# Patient Record
Sex: Female | Born: 1960 | Race: Black or African American | Hispanic: No | Marital: Single | Smoking: Former smoker
Health system: Southern US, Community
[De-identification: ages and names within clinical notes are randomized; demographics above are authoritative.]

## PROBLEM LIST (undated history)

## (undated) DIAGNOSIS — I10 Essential (primary) hypertension: Secondary | ICD-10-CM

## (undated) HISTORY — PX: REPAIR KNEE LIGAMENT: SUR1188

## (undated) HISTORY — PX: TUBAL LIGATION: SHX77

---

## 1999-09-16 ENCOUNTER — Encounter: Admission: RE | Admit: 1999-09-16 | Discharge: 1999-11-17 | Payer: Self-pay | Admitting: Orthopedic Surgery

## 2013-04-04 ENCOUNTER — Emergency Department: Payer: Self-pay | Admitting: Emergency Medicine

## 2013-09-05 ENCOUNTER — Emergency Department: Payer: Self-pay | Admitting: Emergency Medicine

## 2013-12-08 ENCOUNTER — Ambulatory Visit: Payer: Self-pay

## 2013-12-22 ENCOUNTER — Ambulatory Visit: Payer: Self-pay

## 2014-02-07 ENCOUNTER — Emergency Department: Payer: Self-pay | Admitting: Emergency Medicine

## 2014-02-07 LAB — CBC
HCT: 37.9 % (ref 35.0–47.0)
HGB: 12.2 g/dL (ref 12.0–16.0)
MCH: 25.7 pg — ABNORMAL LOW (ref 26.0–34.0)
MCHC: 32.1 g/dL (ref 32.0–36.0)
MCV: 80 fL (ref 80–100)
PLATELETS: 257 10*3/uL (ref 150–440)
RBC: 4.73 10*6/uL (ref 3.80–5.20)
RDW: 15.3 % — ABNORMAL HIGH (ref 11.5–14.5)
WBC: 7.7 10*3/uL (ref 3.6–11.0)

## 2014-02-07 LAB — BASIC METABOLIC PANEL WITH GFR
Anion Gap: 4 — ABNORMAL LOW
BUN: 11 mg/dL
Calcium, Total: 9.3 mg/dL
Chloride: 106 mmol/L
Co2: 31 mmol/L
Creatinine: 0.91 mg/dL
EGFR (African American): >60
EGFR (Non-African Amer.): >60
Glucose: 108 mg/dL — ABNORMAL HIGH
Osmolality: 281
Potassium: 3.4 mmol/L — ABNORMAL LOW
Sodium: 141 mmol/L

## 2014-02-07 LAB — TROPONIN I: Troponin-I: <0.02 ng/mL

## 2014-02-07 LAB — D-DIMER(ARMC): D-Dimer: 444 ng/ml

## 2014-03-13 ENCOUNTER — Emergency Department (HOSPITAL_COMMUNITY): Payer: BC Managed Care – PPO

## 2014-03-13 ENCOUNTER — Emergency Department (HOSPITAL_COMMUNITY)
Admission: EM | Admit: 2014-03-13 | Discharge: 2014-03-14 | Disposition: A | Discharge status: Home / Self Care | Payer: BC Managed Care – PPO | Attending: Emergency Medicine | Admitting: Emergency Medicine

## 2014-03-13 ENCOUNTER — Encounter (HOSPITAL_COMMUNITY): Payer: Self-pay | Admitting: Emergency Medicine

## 2014-03-13 DIAGNOSIS — Y929 Unspecified place or not applicable: Secondary | ICD-10-CM | POA: Insufficient documentation

## 2014-03-13 DIAGNOSIS — S46819A Strain of other muscles, fascia and tendons at shoulder and upper arm level, unspecified arm, initial encounter: Principal | ICD-10-CM

## 2014-03-13 DIAGNOSIS — S46219A Strain of muscle, fascia and tendon of other parts of biceps, unspecified arm, initial encounter: Secondary | ICD-10-CM

## 2014-03-13 DIAGNOSIS — R21 Rash and other nonspecific skin eruption: Secondary | ICD-10-CM | POA: Insufficient documentation

## 2014-03-13 DIAGNOSIS — S43499A Other sprain of unspecified shoulder joint, initial encounter: Secondary | ICD-10-CM | POA: Insufficient documentation

## 2014-03-13 DIAGNOSIS — Y939 Activity, unspecified: Secondary | ICD-10-CM | POA: Insufficient documentation

## 2014-03-13 DIAGNOSIS — Z87891 Personal history of nicotine dependence: Secondary | ICD-10-CM | POA: Insufficient documentation

## 2014-03-13 DIAGNOSIS — X58XXXA Exposure to other specified factors, initial encounter: Secondary | ICD-10-CM | POA: Insufficient documentation

## 2014-03-13 DIAGNOSIS — I10 Essential (primary) hypertension: Secondary | ICD-10-CM | POA: Insufficient documentation

## 2014-03-13 HISTORY — DX: Essential (primary) hypertension: I10

## 2014-03-13 LAB — I-STAT CHEM 8, ED
BUN: 13 mg/dL (ref 6–23)
CALCIUM ION: 1.23 mmol/L (ref 1.12–1.23)
CHLORIDE: 100 meq/L (ref 96–112)
Creatinine, Ser: 0.8 mg/dL (ref 0.50–1.10)
GLUCOSE: 115 mg/dL — AB (ref 70–99)
HEMATOCRIT: 43 % (ref 36.0–46.0)
Hemoglobin: 14.6 g/dL (ref 12.0–15.0)
POTASSIUM: 3.2 meq/L — AB (ref 3.7–5.3)
Sodium: 142 mEq/L (ref 137–147)
TCO2: 30 mmol/L (ref 0–100)

## 2014-03-13 LAB — URINALYSIS, ROUTINE W REFLEX MICROSCOPIC
Bilirubin Urine: NEGATIVE
GLUCOSE, UA: NEGATIVE mg/dL
HGB URINE DIPSTICK: NEGATIVE
Ketones, ur: NEGATIVE mg/dL
Leukocytes, UA: NEGATIVE
Nitrite: NEGATIVE
PH: 6.5 (ref 5.0–8.0)
PROTEIN: NEGATIVE mg/dL
Specific Gravity, Urine: 1.015 (ref 1.005–1.030)
Urobilinogen, UA: 0.2 mg/dL (ref 0.0–1.0)

## 2014-03-13 NOTE — ED Notes (Signed)
Pt reports flank pain on right side.

## 2014-03-13 NOTE — ED Notes (Signed)
Pt presents with Left upper arm swelling x3 months, pt states it's intermittent swelling, pt taking Benadryl OTC and Prednisone with no relief. Pt states she is on Prednisone for recent contact with poison oak. Pt also reports pain to her Right side x1 week

## 2014-03-13 NOTE — ED Notes (Signed)
Pt states swelling to left bicep is decreased from previous but increase in pain.

## 2014-03-14 LAB — CBC
HEMATOCRIT: 38.3 % (ref 36.0–46.0)
HEMOGLOBIN: 12.7 g/dL (ref 12.0–15.0)
MCH: 26.7 pg (ref 26.0–34.0)
MCHC: 33.2 g/dL (ref 30.0–36.0)
MCV: 80.6 fL (ref 78.0–100.0)
Platelets: 352 10*3/uL (ref 150–400)
RBC: 4.75 MIL/uL (ref 3.87–5.11)
RDW: 14.1 % (ref 11.5–15.5)
WBC: 12.6 10*3/uL — AB (ref 4.0–10.5)

## 2014-03-14 MED ORDER — OXYCODONE-ACETAMINOPHEN 5-325 MG PO TABS: 2.0000 | ORAL_TABLET | Freq: Once | ORAL | Status: DC

## 2014-03-14 NOTE — ED Notes (Signed)
MD at bedside. 

## 2014-03-14 NOTE — Discharge Instructions (Signed)
Wear sling for comfort. Take Percocet as prescribed and as needed. Call your surgeon in the morning to schedule close followup as discussed. Minimize use of your left arm until further evaluated by your surgeon.  Muscle Tear  A muscle tear is usually caused by over-exertion or stretching. The muscle often takes a while to heal. Muscle tears require 3 to 4 weeks to heal completely. A history of the injury and a physical exam may be performed. Sometimes, the injury is identified with radiographs and an MRI. Treatment for muscle injuries includes:  Resting and protecting the affected area until pain with motion is gone.  Putting ice on the injured area.  Put ice in a bag.  Place a towel between your skin and the bag.  Leave the ice on for 15 to 20 minutes, 3 to 4 times a day.  After two days, you can use heat to relieve spasms.  Using compression wraps to help control swelling and limit movement.  Raising (elevate) the injured area above the level of the heart (if possible) for the first 1 to 2 days after the injury.  Medicine may be prescribed to reduce pain and inflammation. Avoid strenuous activities that bring on muscle pain. Exercises to strengthen and stretch the injured muscle can help heal the muscle and prevent repeated injury. After the pain and swelling are gone, you may begin gradual strengthening exercises. Begin range-of-motion exercises and gentle stretching after 3 to 4 days of rest.  SEEK MEDICAL CARE IF:  Your injured muscle is not improving after 1 week of treatment.  Document Released: 12/03/2004 Document Revised: 01/18/2012 Document Reviewed: 05/10/2009  [facility] Patient Information 2014 [place].

## 2014-03-14 NOTE — ED Provider Notes (Signed)
CSN: [id number]     Arrival date & time 03/13/14  1735 History   First MD Initiated Contact with Patient 03/13/14 2355     Chief Complaint  Patient presents with  . Arm Swelling     (Consider location/radiation/quality/duration/timing/severity/associated sxs/prior Treatment) HPI History provided by patient. Had a work related injury 3 months ago. At that time had a torn meniscus in her knee, right wrist injury in addition to torn left biceps tendon. She is followed by [employer]. primary care physician. She had her knee repaired by Dr. Berenice Primas orthopedics. She was told by Dr. Berenice Primas to have an MRI of her left arm before having it repaired. Patient has seen her primary care physician, who referred her to a different orthopedic surgeon but she prefers to see Dr. Berenice Primas. She has not had an MRI. She presents to emergency room today requesting an MRI and pain medication. She is currently taking prednisone because she was moving mulch and has been using her left arm regularly. She has some increased swelling and pain tonight. She takes Percocet at home. She denies any weakness or numbness. No rash or new symptoms otherwise.  Past Medical History  Diagnosis Date  . Hypertension    Past Surgical History  Procedure Laterality Date  . Repair knee ligament    . Tubal ligation     History reviewed. No pertinent family history. History  Substance Use Topics  . Smoking status: Former Research scientist (life sciences)  . Smokeless tobacco: Not on file  . Alcohol Use: Yes   OB History   Grav Para Term Preterm Abortions TAB SAB Ect Mult Living                 Review of Systems  Constitutional: Negative for fever and chills.  Respiratory: Negative for shortness of breath.   Cardiovascular: Negative for chest pain.  Gastrointestinal: Negative for abdominal pain.  Genitourinary: Negative for flank pain.  Musculoskeletal: Negative for back pain, neck pain and neck stiffness.  Skin: Positive for rash.  Neurological:  Negative for headaches.  All other systems reviewed and are negative.     Allergies  Aspirin  Home Medications   Prior to Admission medications   Medication Sig Start Date End Date Taking? Authorizing Provider  diphenhydrAMINE (SOMINEX) 25 MG tablet Take 25 mg by mouth at bedtime as needed for itching, allergies or sleep.   Yes Historical Provider, MD  predniSONE (DELTASONE) 20 MG tablet Take 20 mg by mouth See admin instructions. Take 3 tab in AM for 5 days , 2 tab in AM for 5 days , 1Tab in AM for 5 days,then 1/2 tab for 4 days.  Started last Wednesday   Yes Historical Provider, MD   BP 143/88  Pulse 88  Temp(Src) 97.4 F (36.3 C) (Oral)  Resp 18  SpO2 100% Physical Exam  Constitutional: She is oriented to person, place, and time. She appears well-developed and well-nourished.  HENT:  Head: Normocephalic and atraumatic.  Eyes: EOM are normal. Pupils are equal, round, and reactive to light.  Neck: Neck supple.  Cardiovascular: Normal rate, regular rhythm and intact distal pulses.   Pulmonary/Chest: Effort normal and breath sounds normal. No respiratory distress.  Abdominal: Soft. There is no tenderness.  Musculoskeletal: Normal range of motion.  Left biceps region area of swelling consistent with reported history of muscle injury. Good range of motion throughout without erythema or increased warmth to touch. Good range of motion at shoulder, elbow and wrist with distal neurovascular intact.  Neurological: She is alert and oriented to person, place, and time.  Skin: Skin is warm and dry.  Mild erythematous rash to right forearm, with mild excoriations. No petechiae or purpura.    ED Course  Procedures (including critical care time) Labs Review Labs Reviewed  CBC - Abnormal; Notable for the following:    WBC 12.6 (*)    All other components within normal limits  I-STAT CHEM 8, ED - Abnormal; Notable for the following:    Potassium 3.2 (*)    Glucose, Bld 115 (*)    All  other components within normal limits  URINALYSIS, ROUTINE W REFLEX MICROSCOPIC    Imaging Review Dg Humerus Left  03/13/2014   CLINICAL DATA:  Injured left humerus, pain  EXAM: LEFT HUMERUS - 2+ VIEW  COMPARISON:  None.  FINDINGS: There is no evidence of fracture or other focal bone lesions. Soft tissues are unremarkable.  IMPRESSION: Negative.   Electronically Signed   By: Kathreen Devoid   On: 03/13/2014 22:58   Patient had requested an x-ray and blood work which was reviewed as above. Patient was evaluated after hours and no MRI is available at this time. I had a long talk with her about options for getting outpatient MRI which include her primary care physician, her orthopedic physician or her doctors at [facility].  She is agreeable to call her orthopedic surgeon and primary care physician in the morning to schedule this study. She was provided with a sling, given 2 Percocet. She will continue medications at home as prescribed and followup as an outpatient. Return precautions provided and verbalizes understood.   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 She is followed by primary care to orthopedics and Work[employer]'s [employer]. she will call the morning to schedule followup for MRI as needed and surgical repair. No indication for further workup in the ER at this time. No indication for emergent MRI.    Teressa Lower, MD 03/14/14 [phone]

## 2016-02-19 ENCOUNTER — Other Ambulatory Visit: Payer: Self-pay | Admitting: Family Medicine

## 2016-02-19 ENCOUNTER — Ambulatory Visit
Admission: RE | Admit: 2016-02-19 | Discharge: 2016-02-19 | Disposition: A | Discharge status: Home / Self Care | Payer: Disability Insurance | Source: Ambulatory Visit | Attending: Family Medicine | Admitting: Family Medicine

## 2016-02-19 DIAGNOSIS — M25562 Pain in left knee: Secondary | ICD-10-CM | POA: Diagnosis present

## 2016-02-19 DIAGNOSIS — M549 Dorsalgia, unspecified: Secondary | ICD-10-CM

## 2016-02-19 DIAGNOSIS — M5136 Other intervertebral disc degeneration, lumbar region: Secondary | ICD-10-CM | POA: Insufficient documentation

## 2016-12-09 ENCOUNTER — Other Ambulatory Visit: Payer: Self-pay | Admitting: Preventative Medicine

## 2016-12-09 ENCOUNTER — Ambulatory Visit
Admission: RE | Admit: 2016-12-09 | Discharge: 2016-12-09 | Disposition: A | Discharge status: Home / Self Care | Payer: Disability Insurance | Source: Ambulatory Visit | Attending: Preventative Medicine | Admitting: Preventative Medicine

## 2016-12-09 DIAGNOSIS — M1711 Unilateral primary osteoarthritis, right knee: Secondary | ICD-10-CM | POA: Insufficient documentation

## 2016-12-09 DIAGNOSIS — D259 Leiomyoma of uterus, unspecified: Secondary | ICD-10-CM | POA: Insufficient documentation

## 2016-12-09 DIAGNOSIS — M1710 Unilateral primary osteoarthritis, unspecified knee: Secondary | ICD-10-CM

## 2016-12-09 DIAGNOSIS — G5600 Carpal tunnel syndrome, unspecified upper limb: Secondary | ICD-10-CM | POA: Insufficient documentation

## 2016-12-09 DIAGNOSIS — I10 Essential (primary) hypertension: Secondary | ICD-10-CM | POA: Diagnosis not present

## 2016-12-09 DIAGNOSIS — M539 Dorsopathy, unspecified: Secondary | ICD-10-CM

## 2016-12-09 DIAGNOSIS — M259 Joint disorder, unspecified: Secondary | ICD-10-CM

## 2016-12-09 DIAGNOSIS — Z8572 Personal history of non-Hodgkin lymphomas: Secondary | ICD-10-CM | POA: Insufficient documentation

## 2016-12-09 DIAGNOSIS — E0590 Thyrotoxicosis, unspecified without thyrotoxic crisis or storm: Secondary | ICD-10-CM | POA: Diagnosis not present

## 2018-07-07 IMAGING — CR DG LUMBAR SPINE 2-3V
3 series · 3 of 3 positions shown · non-contrast
Comparison: Lumbar spine films of 02/19/2016

CLINICAL DATA: Chronic back pain, no acute injury

EXAM:
LUMBAR SPINE - 2-3 VIEW

[l-spine ap]
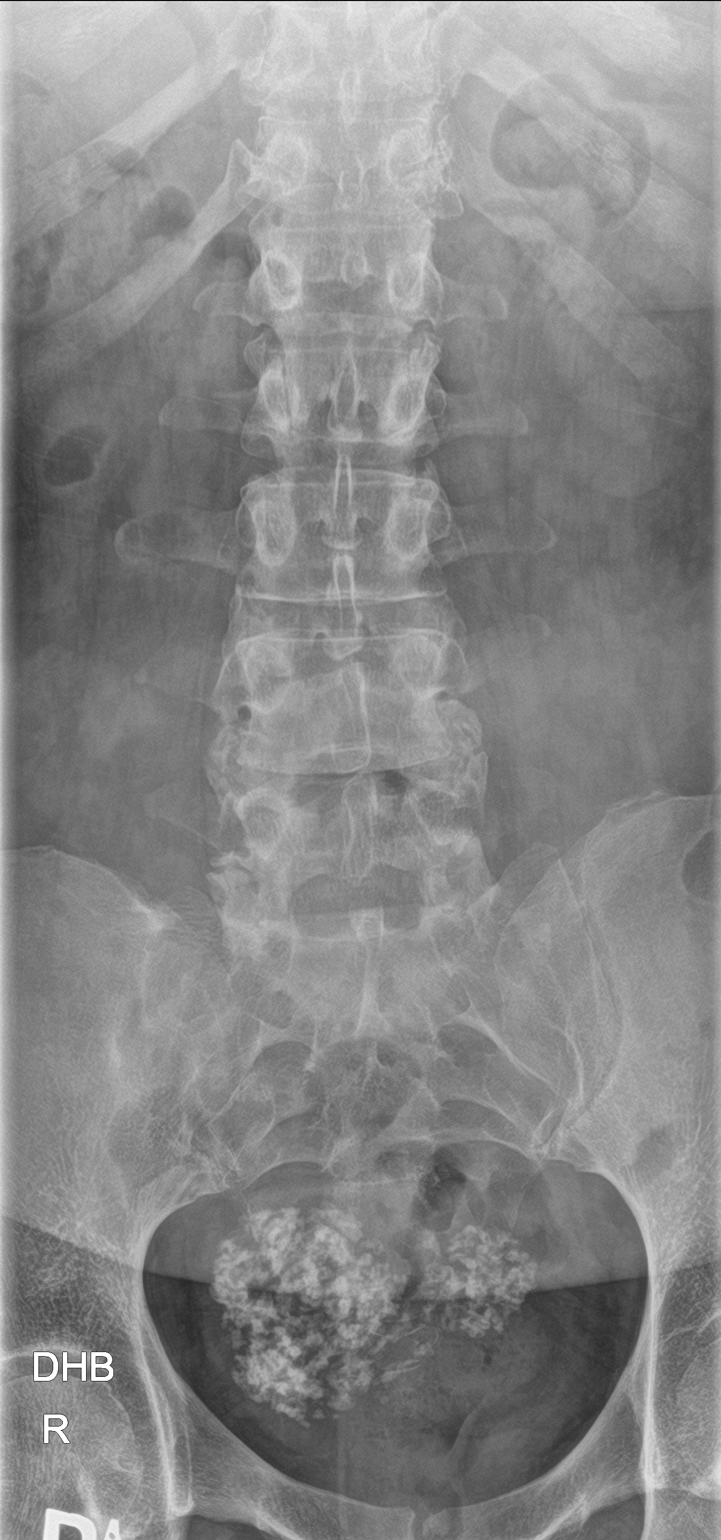

[l-spine lat]
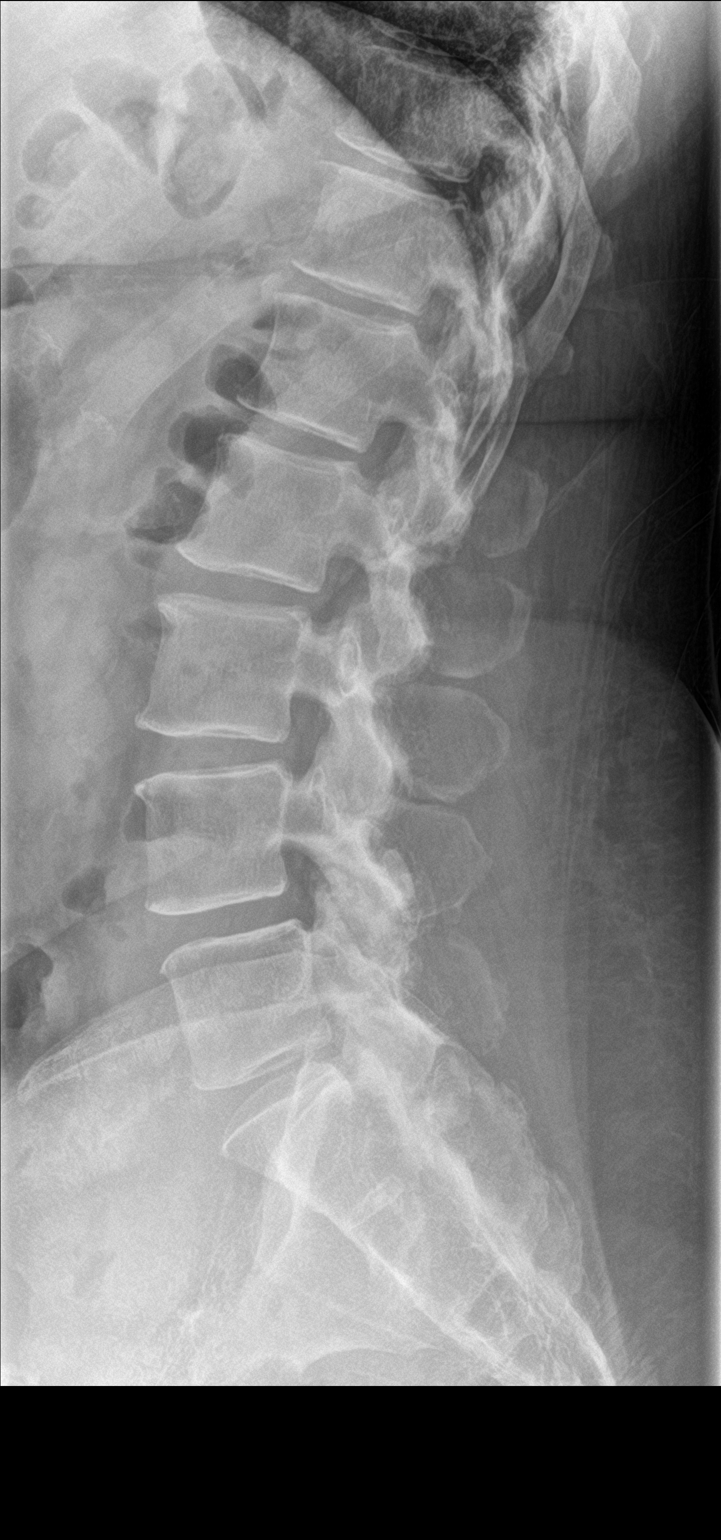

[l-spine spot]
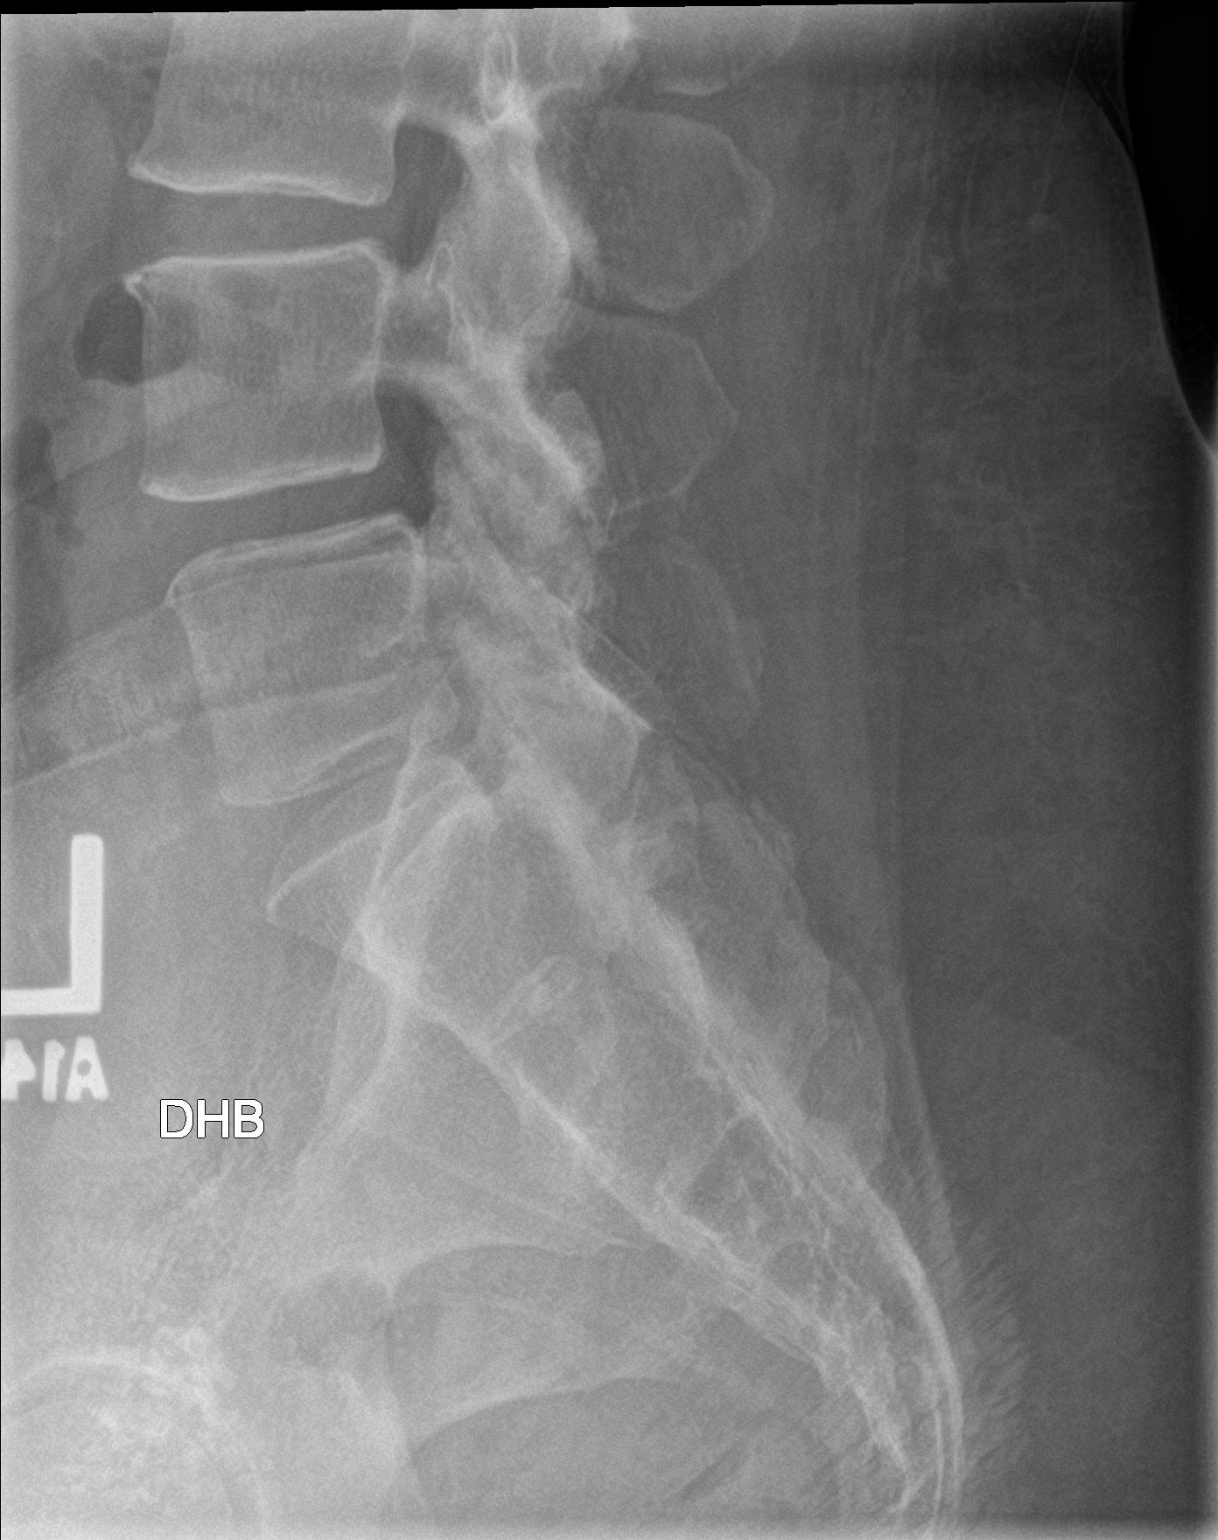

[3 of 3 positions shown; findings below may reference images not displayed]

FINDINGS: The lumbar vertebrae are in normal alignment with slightly
diminished disc space at L4-5. The remainder of disc spaces appear
normal. No compression deformity is seen. The SI joints are
corticated. Mottled calcifications in the pelvis are consistent with
calcified uterine fibroids.
IMPRESSION: Normal alignment. Minimally decreased disc space at L4-5. Calcified
uterine fibroids.

## 2018-07-07 IMAGING — CR DG SHOULDER 2+V*R*
3 series · 3 of 3 positions shown · non-contrast
Comparison: None.

CLINICAL DATA: Shoulder pain for many years, no acute injury

EXAM:
RIGHT SHOULDER - 2+ VIEW

[shoulder grashey]
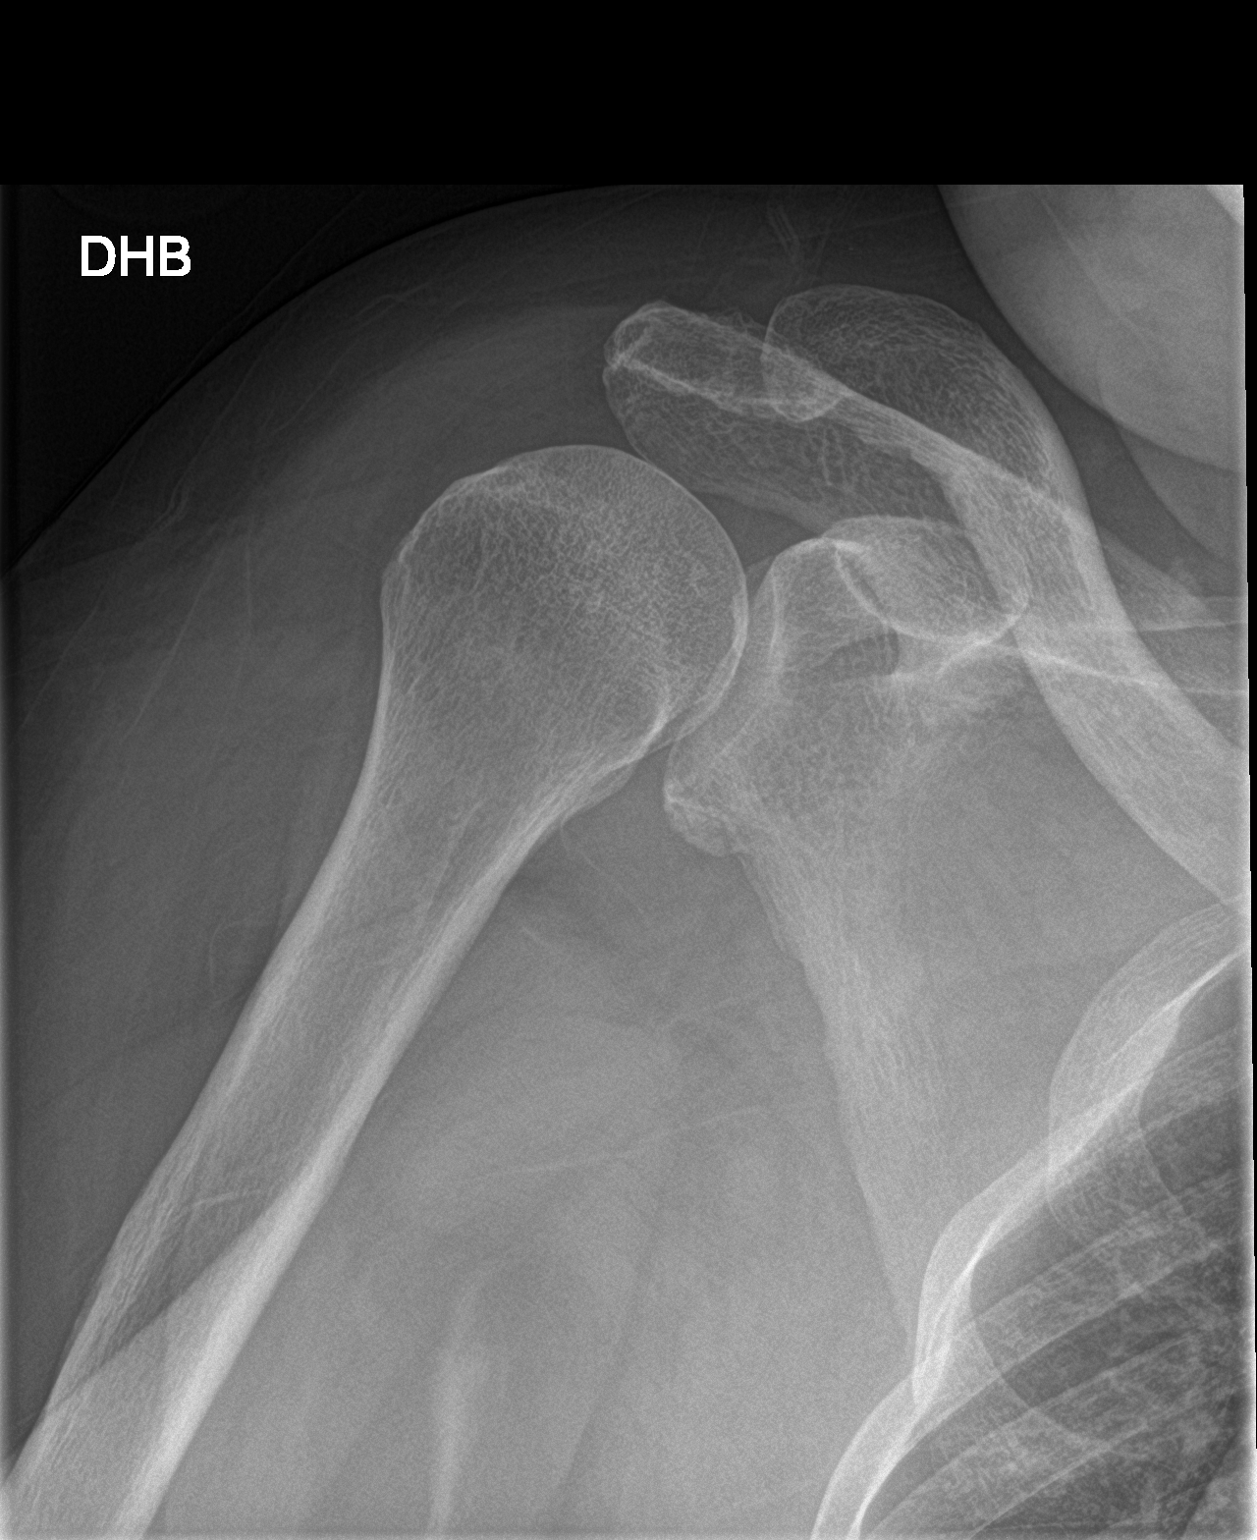

[shoulder y view]
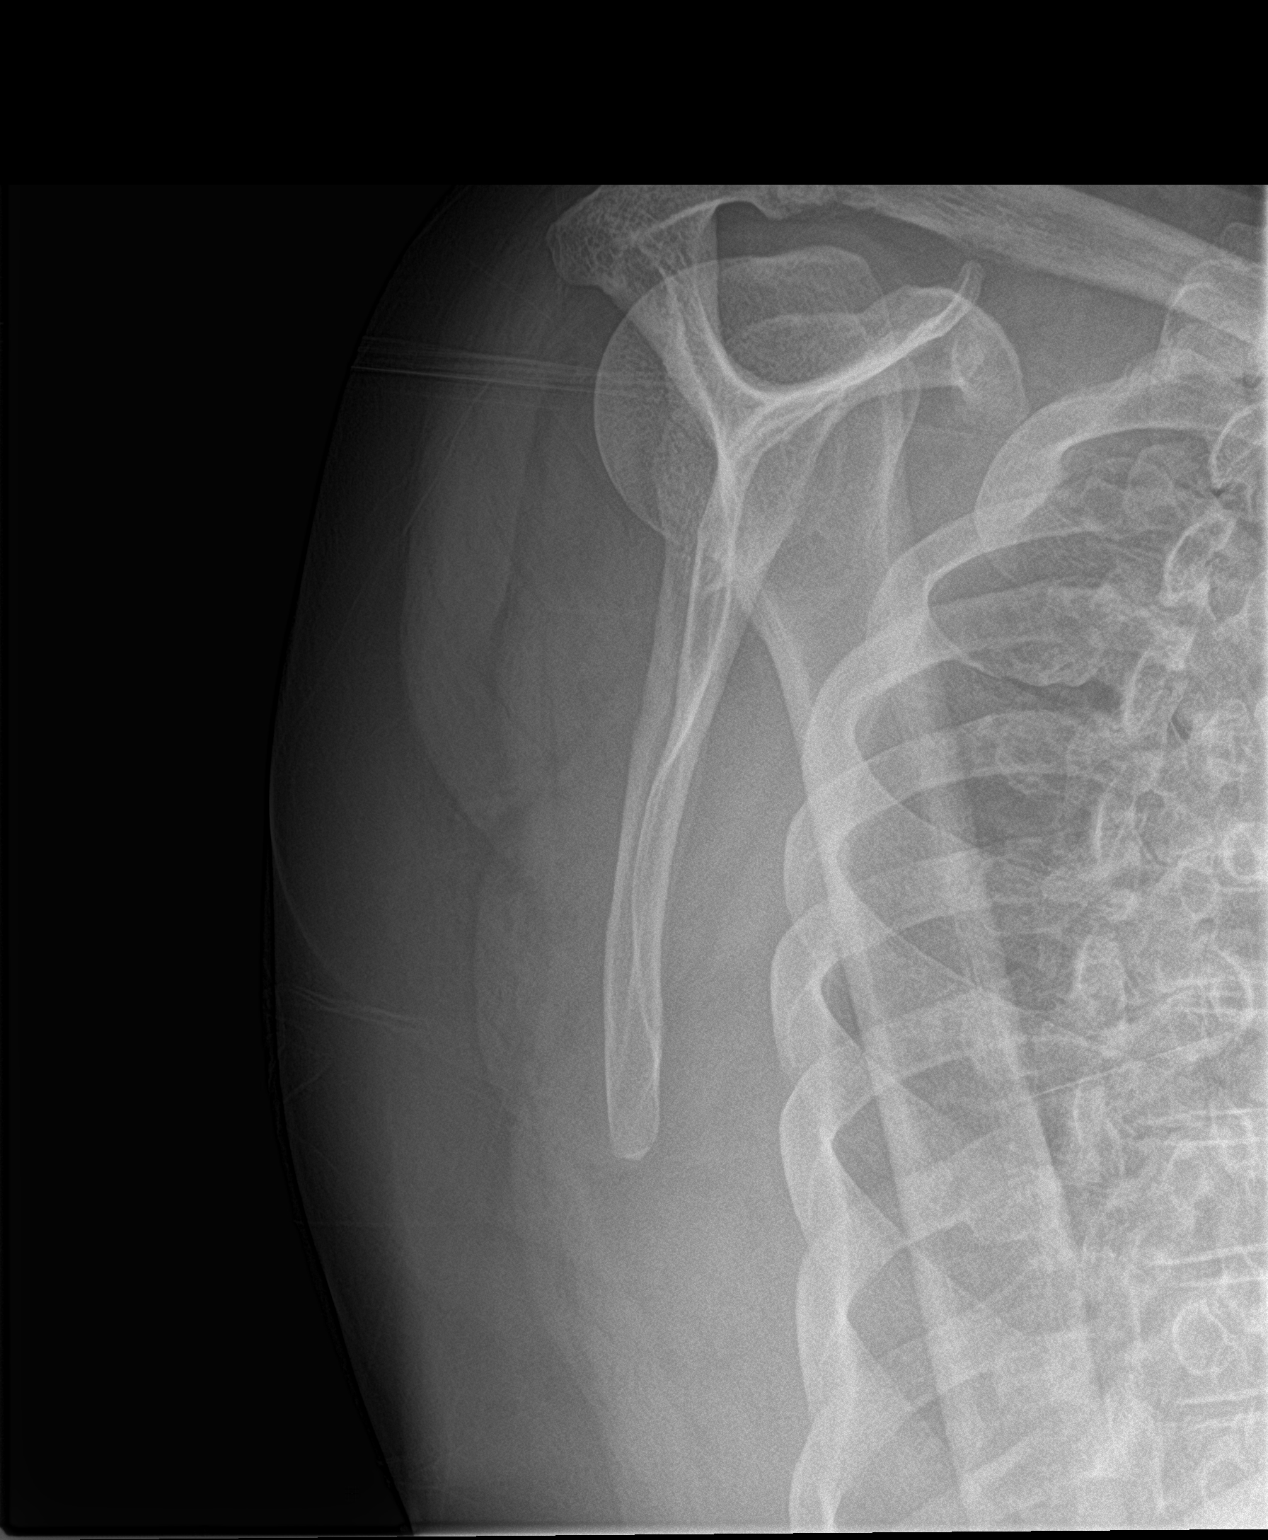

[shoulder axillary]
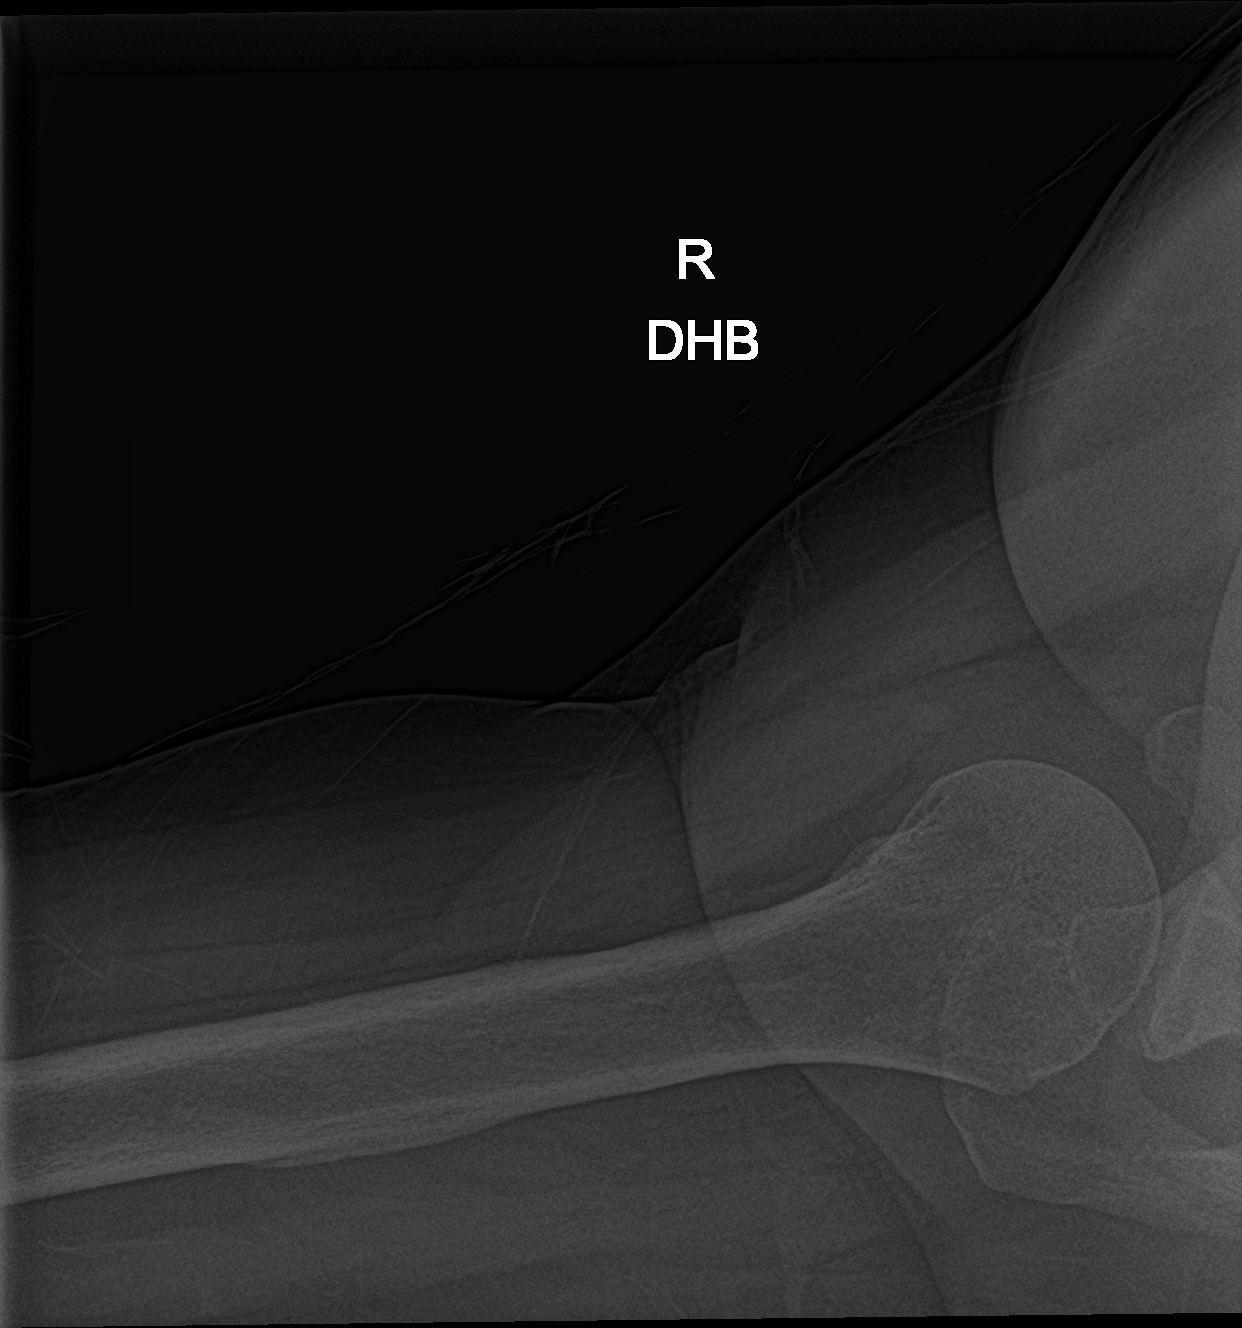

[3 of 3 positions shown; findings below may reference images not displayed]

FINDINGS: The right humeral head is in normal position and the glenohumeral
joint space appears normal. The right AC joint is normally aligned.
No acute abnormality is seen. No significant degenerative change is
noted.
IMPRESSION: Negative.
